# Patient Record
Sex: Female | Born: 1990 | Race: White | Hispanic: No | Marital: Single
Health system: Southern US, Community
[De-identification: ages and names within clinical notes are randomized; demographics above are authoritative.]

## PROBLEM LIST (undated history)

## (undated) DIAGNOSIS — I471 Supraventricular tachycardia, unspecified: Secondary | ICD-10-CM

---

## 2018-02-02 ENCOUNTER — Emergency Department (HOSPITAL_COMMUNITY): Payer: PRIVATE HEALTH INSURANCE

## 2018-02-02 ENCOUNTER — Emergency Department (HOSPITAL_COMMUNITY)
Admission: EM | Admit: 2018-02-02 | Discharge: 2018-02-03 | Disposition: A | Discharge status: Home / Self Care | Payer: PRIVATE HEALTH INSURANCE | Attending: Emergency Medicine | Admitting: Emergency Medicine

## 2018-02-02 ENCOUNTER — Encounter (HOSPITAL_COMMUNITY): Payer: Self-pay | Admitting: Emergency Medicine

## 2018-02-02 DIAGNOSIS — Y999 Unspecified external cause status: Secondary | ICD-10-CM | POA: Diagnosis not present

## 2018-02-02 DIAGNOSIS — R55 Syncope and collapse: Secondary | ICD-10-CM

## 2018-02-02 DIAGNOSIS — W1789XA Other fall from one level to another, initial encounter: Secondary | ICD-10-CM | POA: Insufficient documentation

## 2018-02-02 DIAGNOSIS — S29012A Strain of muscle and tendon of back wall of thorax, initial encounter: Secondary | ICD-10-CM

## 2018-02-02 DIAGNOSIS — I471 Supraventricular tachycardia: Secondary | ICD-10-CM | POA: Insufficient documentation

## 2018-02-02 DIAGNOSIS — Y929 Unspecified place or not applicable: Secondary | ICD-10-CM | POA: Diagnosis not present

## 2018-02-02 DIAGNOSIS — M542 Cervicalgia: Secondary | ICD-10-CM | POA: Diagnosis not present

## 2018-02-02 DIAGNOSIS — S0181XA Laceration without foreign body of other part of head, initial encounter: Secondary | ICD-10-CM | POA: Diagnosis not present

## 2018-02-02 DIAGNOSIS — Y939 Activity, unspecified: Secondary | ICD-10-CM | POA: Insufficient documentation

## 2018-02-02 HISTORY — DX: Supraventricular tachycardia: I47.1

## 2018-02-02 HISTORY — DX: Supraventricular tachycardia, unspecified: I47.10

## 2018-02-02 LAB — BASIC METABOLIC PANEL
ANION GAP: 12 (ref 5–15)
BUN: 12 mg/dL (ref 6–20)
CALCIUM: 9 mg/dL (ref 8.9–10.3)
CO2: 22 mmol/L (ref 22–32)
Chloride: 104 mmol/L (ref 98–111)
Creatinine, Ser: 0.72 mg/dL (ref 0.44–1.00)
Glucose, Bld: 97 mg/dL (ref 70–99)
Potassium: 3.6 mmol/L (ref 3.5–5.1)
Sodium: 138 mmol/L (ref 135–145)

## 2018-02-02 LAB — I-STAT BETA HCG BLOOD, ED (MC, WL, AP ONLY)

## 2018-02-02 LAB — CBG MONITORING, ED: GLUCOSE-CAPILLARY: 96 mg/dL (ref 70–99)

## 2018-02-02 MED ORDER — FENTANYL CITRATE (PF) 100 MCG/2ML IJ SOLN
50.0000 ug | INTRAMUSCULAR | Status: DC | PRN
  Administered 2018-02-02: 50 ug via INTRAVENOUS
  Filled 2018-02-02: qty 2

## 2018-02-02 MED ORDER — SODIUM CHLORIDE 0.9 % IV BOLUS
1000.0000 mL | Freq: Once | INTRAVENOUS | Status: AC
  Administered 2018-02-02: 1000 mL via INTRAVENOUS

## 2018-02-02 MED ORDER — LIDOCAINE-EPINEPHRINE (PF) 2 %-1:200000 IJ SOLN
20.0000 mL | Freq: Once | INTRAMUSCULAR | Status: DC
  Filled 2018-02-02: qty 20

## 2018-02-02 NOTE — ED Provider Notes (Signed)
[facility] EMERGENCY DEPARTMENT Provider Note   CSN: [id number] Arrival date & time: 02/02/18  2145     History   Chief Complaint Chief Complaint  Patient presents with  . Loss of Consciousness  . Laceration    HPI Natalie Jackson is a 27 y.o. female.  Patient presented with chin laceration and back pain since syncopal episode at the concert.  Patient was in the heat and did not have very much to drink.  She started feeling lightheaded and then gradually felt worse and then blacked out.  Patient remembers people helping her up after she passed out and she thinks she hit stairs.  Patient has a history of SVT however no other cardiac history.  Patient denies alcohol or drug abuse.  Pain with range of motion     Past Medical History:  Diagnosis Date  . SVT (supraventricular tachycardia) (HCC)    occasional    There are no active problems to display for this patient.   History reviewed. No pertinent surgical history.   OB History   None      Home Medications    Prior to Admission medications   Not on File    Family History History reviewed. No pertinent family history.  Social History Social History   Tobacco Use  . Smoking status: Not on file  Substance Use Topics  . Alcohol use: Not on file  . Drug use: Not on file     Allergies   Patient has no allergy information on record.   Review of Systems Review of Systems  Constitutional: Negative for chills and fever.  HENT: Negative for congestion.   Eyes: Negative for visual disturbance.  Respiratory: Negative for shortness of breath.   Cardiovascular: Negative for chest pain.  Gastrointestinal: Negative for abdominal pain and vomiting.  Genitourinary: Negative for dysuria and flank pain.  Musculoskeletal: Positive for back pain and neck pain. Negative for neck stiffness.  Skin: Negative for rash.  Neurological: Positive for syncope and light-headedness. Negative for headaches.      Physical Exam Updated Vital Signs BP 107/66   Pulse 80   Temp (!) 97.5 F (36.4 C) (Oral)   Resp 18   Ht 5\' 5"  (1.651 m)   Wt 62.1 kg   SpO2 100%   BMI 22.80 kg/m   Physical Exam  Constitutional: She is oriented to person, place, and time. She appears well-developed and well-nourished.  HENT:  Head: Normocephalic.  Patient has 2.5 cm laceration beneath the chin mild gaping.  Eyes: Conjunctivae are normal. Right eye exhibits no discharge. Left eye exhibits no discharge.  Neck: Normal range of motion. Neck supple. No tracheal deviation present.  Cardiovascular: Normal rate and regular rhythm.  Pulmonary/Chest: Effort normal and breath sounds normal.  Abdominal: Soft. She exhibits no distension. There is no tenderness. There is no guarding.  Musculoskeletal: She exhibits tenderness. She exhibits no edema.  Patient has mild tenderness lower cervical region midline, mid and lower thoracic and proximal lumbar midline and paraspinal.  C-collar in place.  Neurological: She is alert and oriented to person, place, and time. She has normal strength. No cranial nerve deficit. GCS eye subscore is 4. GCS verbal subscore is 5. GCS motor subscore is 6.  Skin: Skin is warm. No rash noted.  Psychiatric: She has a normal mood and affect.  Nursing note and vitals reviewed.    ED Treatments / Results  Labs (all labs ordered are listed, but only abnormal results are  displayed) Labs Reviewed  CBC - Abnormal; Notable for the following components:      Result Value   WBC 10.7 (*)    RBC 3.81 (*)    All other components within normal limits  BASIC METABOLIC PANEL  URINALYSIS, ROUTINE W REFLEX MICROSCOPIC  CBG MONITORING, ED  I-STAT BETA HCG BLOOD, ED ([facility], [facility], [facility] ONLY)    EKG EKG Interpretation  Date/Time:  Saturday February 02 2018 22:40:09 EDT Ventricular Rate:  84 PR Interval:    QRS Duration: 83 QT Interval:  365 QTC Calculation: 432 R Axis:   74 Text Interpretation:  Sinus  rhythm Probable left atrial enlargement RSR' in V1 or V2, probably normal variant Nonspecific T abnrm, anterolateral leads Confirmed by Blane Ohara [phone]) on 02/02/2018 11:26:56 PM   Radiology Dg Thoracic Spine 2 View  Result Date: 02/02/2018 CLINICAL DATA:  Fall, syncopal episode EXAM: THORACIC SPINE 2 VIEWS COMPARISON:  None. FINDINGS: Twelve rib pairs. Thoracic alignment is within normal limits. The vertebral body heights are maintained. IMPRESSION: No acute osseous abnormality Electronically Signed   By: Jasmine Pang M.D.   On: 02/02/2018 23:51   Dg Lumbar Spine 2-3 Views  Result Date: 02/02/2018 CLINICAL DATA:  Fall EXAM: LUMBAR SPINE - 2-3 VIEW COMPARISON:  None. FINDINGS: There is no evidence of lumbar spine fracture. Alignment is normal. Intervertebral disc spaces are maintained. IMPRESSION: Negative. Electronically Signed   By: Jasmine Pang M.D.   On: 02/02/2018 23:52   Ct Cervical Spine Wo Contrast  Result Date: 02/03/2018 CLINICAL DATA:  27 year old female with C-spine trauma. EXAM: CT CERVICAL SPINE WITHOUT CONTRAST TECHNIQUE: Multidetector CT imaging of the cervical spine was performed without intravenous contrast. Multiplanar CT image reconstructions were also generated. COMPARISON:  None. FINDINGS: Alignment: Normal. Skull base and vertebrae: No acute fracture. No primary bone lesion or focal pathologic process. Soft tissues and spinal canal: No prevertebral fluid or swelling. No visible canal hematoma. Disc levels:  No acute findings.  No degenerative changes. Upper chest: Negative. Other: None IMPRESSION: No acute/traumatic cervical spine pathology. Electronically Signed   By: Elgie Collard M.D.   On: 02/03/2018 00:21    Procedures .Marland KitchenLaceration Repair Date/Time: 02/03/2018 12:28 AM Performed by: Blane Ohara, MD Authorized by: Blane Ohara, MD   Consent:    Consent obtained:  Verbal   Consent given by:  Patient   Risks discussed:  Infection, pain, retained  foreign body, poor cosmetic result, need for additional repair, nerve damage and poor wound healing   Alternatives discussed:  No treatment Anesthesia (see MAR for exact dosages):    Anesthesia method:  Local infiltration   Local anesthetic:  Lidocaine 2% WITH epi Laceration details:    Location:  Face   Face location:  Chin   Length (cm):  3   Depth (mm):  5 Repair type:    Repair type:  Simple Pre-procedure details:    Preparation:  Patient was prepped and draped in usual sterile fashion and imaging obtained to evaluate for foreign bodies Exploration:    Hemostasis achieved with:  Direct pressure   Wound exploration: wound explored through full range of motion     Wound extent: no muscle damage noted, no nerve damage noted and no tendon damage noted     Contaminated: no   Treatment:    Area cleansed with:  Shur-Clens   Amount of cleaning:  Standard   Irrigation solution:  Sterile saline   Irrigation volume:  10   Irrigation method:  Syringe  Visualized foreign bodies/material removed: no   Skin repair:    Repair method:  Sutures   Suture size:  5-0   Suture material:  Prolene   Suture technique:  Simple interrupted   Number of sutures:  4 Approximation:    Approximation:  Close Post-procedure details:    Dressing:  Open (no dressing)   Patient tolerance of procedure:  Tolerated well, no immediate complications   (including critical care time)  Medications Ordered in ED Medications  fentaNYL (SUBLIMAZE) injection 50 mcg (50 mcg Intravenous Given 02/02/18 2305)  lidocaine-EPINEPHrine (XYLOCAINE W/EPI) 2 %-1:200000 (PF) injection 20 mL (has no administration in time range)  ibuprofen (ADVIL,MOTRIN) tablet 600 mg (has no administration in time range)  sodium chloride 0.9 % bolus 1,000 mL (0 mLs Intravenous Stopped 02/03/18 0006)     Initial Impression / Assessment and Plan / ED Course  I have reviewed the triage vital signs and the nursing notes.  Pertinent labs &  imaging results that were available during my care of the patient were reviewed by me and considered in my medical decision making (see chart for details).    Patient presents after syncopal episode gradual onset likely from dehydration and not keeping up with fluids in the heat.  EKG reviewed no acute findings.  Plan for screening blood work, IV fluid bolus, x-rays and CT cervical.  Laceration repair and if unremarkable outpatient follow-up. CT scan negative for acute fracture x-rays no fracture.  Pain meds given in the ER.  Laceration repaired without difficulty.  Follow-up discussed blood work reviewed with patient no acute findings pregnancy test negative.  IV fluids given.  Results and differential diagnosis were discussed with the patient/parent/guardian. Xrays were independently reviewed by myself.  Close follow up outpatient was discussed, comfortable with the plan.   Medications  fentaNYL (SUBLIMAZE) injection 50 mcg (50 mcg Intravenous Given 02/02/18 2305)  lidocaine-EPINEPHrine (XYLOCAINE W/EPI) 2 %-1:200000 (PF) injection 20 mL (has no administration in time range)  ibuprofen (ADVIL,MOTRIN) tablet 600 mg (has no administration in time range)  sodium chloride 0.9 % bolus 1,000 mL (0 mLs Intravenous Stopped 02/03/18 0006)    Vitals:   02/02/18 2153 02/02/18 2154 02/02/18 2245 02/02/18 2300  BP: 130/69  115/66 107/66  Pulse: 75  80 80  Resp: 18     Temp: (!) 97.5 F (36.4 C)     TempSrc: Oral     SpO2: 100%  100% 100%  Weight:  62.1 kg    Height:  5\' 5"  (1.651 m)      Final diagnoses:  Facial laceration, initial encounter  Syncope and collapse  Strain of thoracic back region      Final Clinical Impressions(s) / ED Diagnoses   Final diagnoses:  Facial laceration, initial encounter  Syncope and collapse  Strain of thoracic back region    ED Discharge Orders    None       Blane Ohara, MD 02/03/18 [phone]

## 2018-02-02 NOTE — Discharge Instructions (Signed)
Take Tylenol and Motrin as needed for pain use ice as needed. Stay well-hydrated follow-up with primary doctor. Sutures removed in approximately 5 to 6 days. Watch for signs of infection of laceration.

## 2018-02-02 NOTE — ED Triage Notes (Signed)
  Patient BIB EMS after syncopal episode at concert.  Patient was at concert and had not had anything to eat/drink since earlier this morning and said she felt herself about to pass out.  Patient tried to get some water and blacked out causing her to fall face first up some stairs.  Patient is complaining of lower back pain and has 1 in laceration on her chin.  Patient states her only hx is occasional SVT.  Patient is A&O x 4.  No ETOH or drug use per patient.

## 2018-02-03 LAB — CBC
HCT: 37.4 % (ref 36.0–46.0)
HEMOGLOBIN: 12 g/dL (ref 12.0–15.0)
MCH: 31.5 pg (ref 26.0–34.0)
MCHC: 32.1 g/dL (ref 30.0–36.0)
MCV: 98.2 fL (ref 78.0–100.0)
Platelets: DECREASED 10*3/uL (ref 150–400)
RBC: 3.81 MIL/uL — ABNORMAL LOW (ref 3.87–5.11)
RDW: 13 % (ref 11.5–15.5)
WBC: 10.7 10*3/uL — ABNORMAL HIGH (ref 4.0–10.5)

## 2018-02-03 MED ORDER — IBUPROFEN 400 MG PO TABS
600.0000 mg | ORAL_TABLET | Freq: Once | ORAL | Status: AC
  Administered 2018-02-03: 600 mg via ORAL
  Filled 2018-02-03: qty 1

## 2020-06-14 IMAGING — DX DG LUMBAR SPINE 2-3V
3 series · 3 of 3 positions shown · non-contrast
Comparison: None.

CLINICAL DATA: Fall

EXAM:
LUMBAR SPINE - 2-3 VIEW

[l-spine ap]
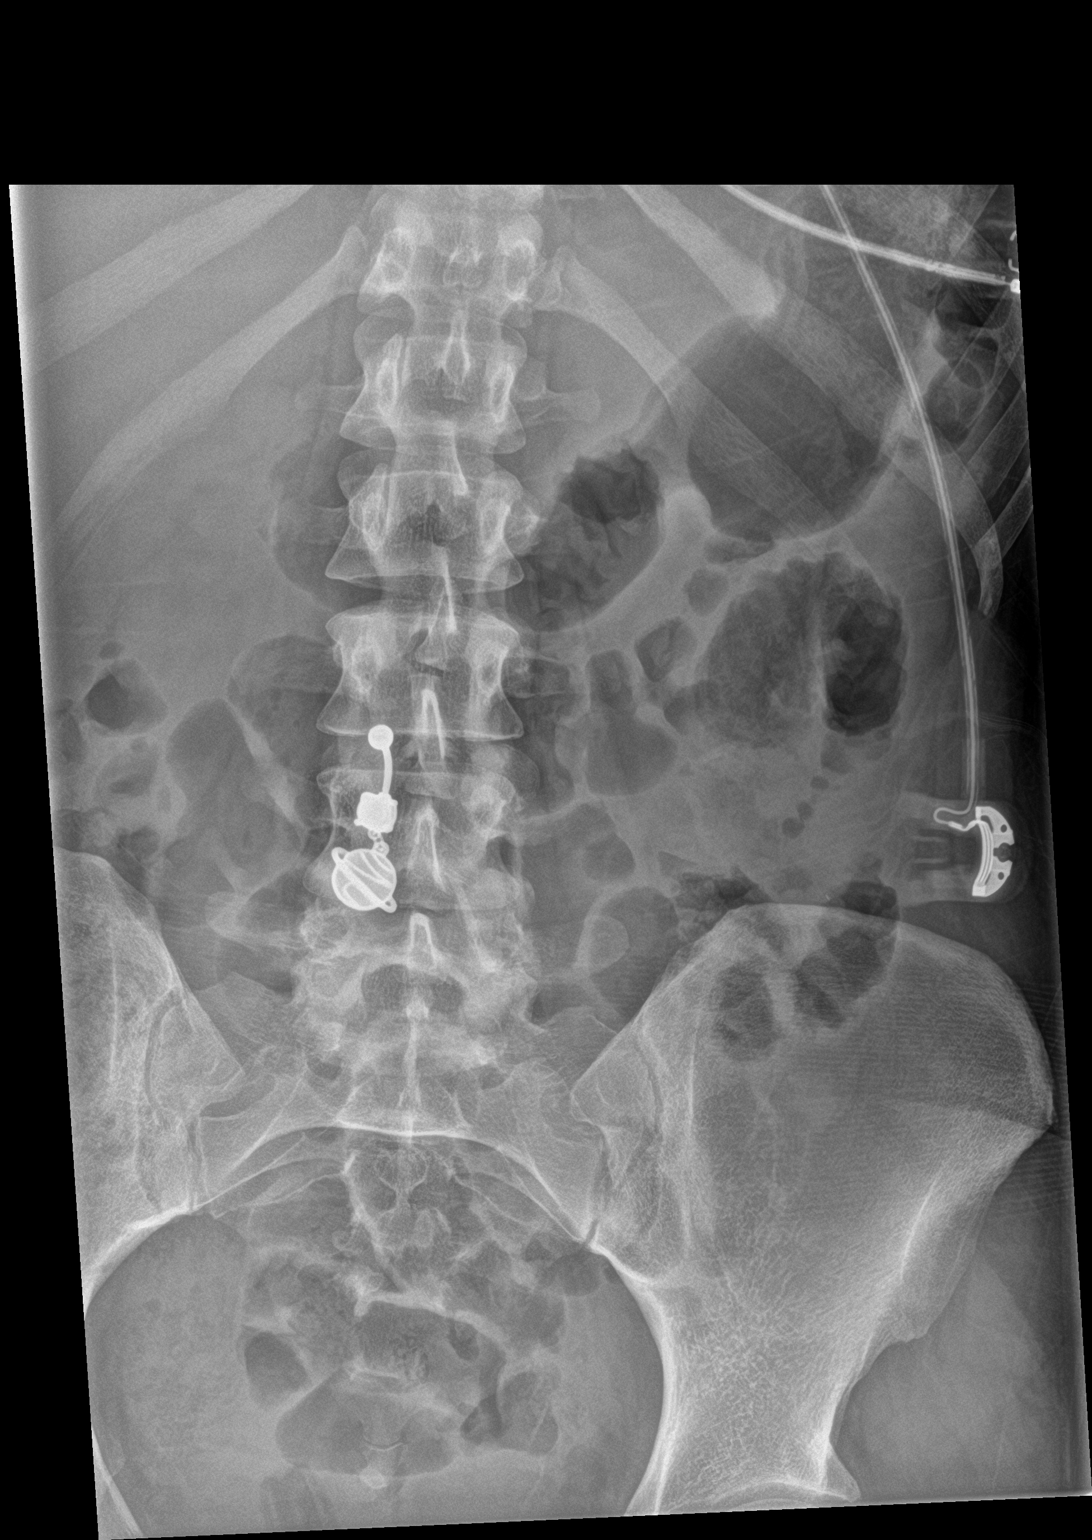

[l-spine lat]
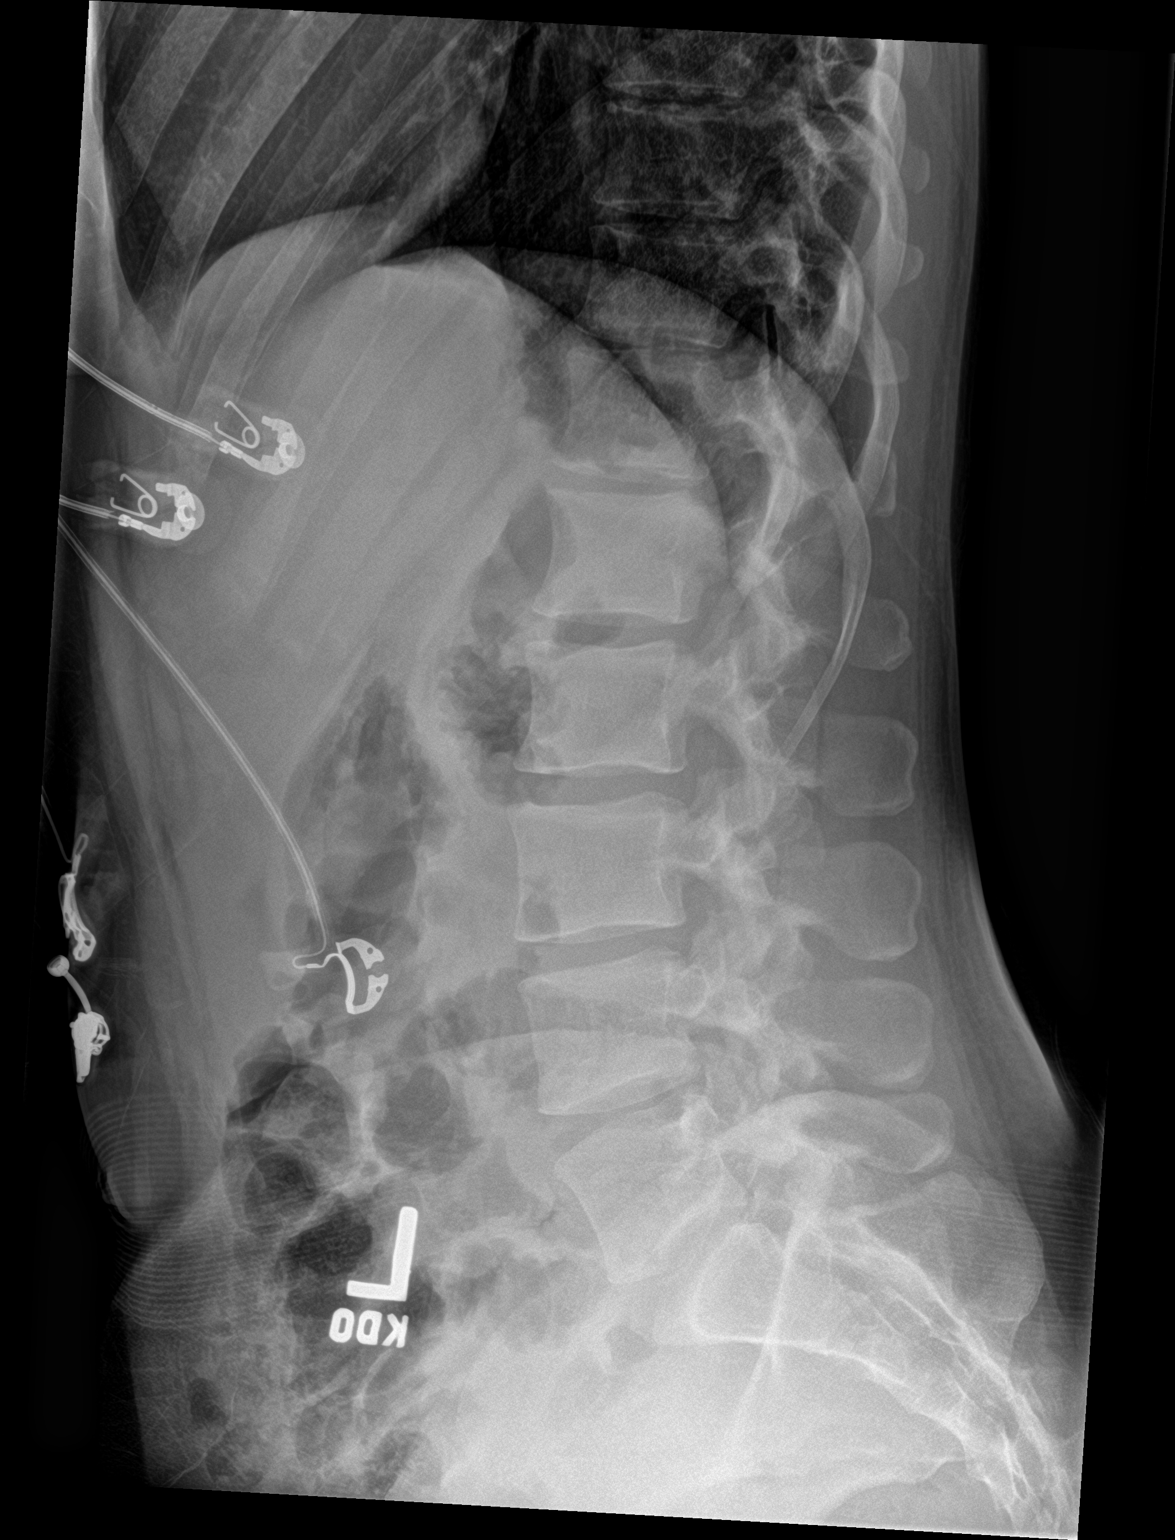

[l-spine spot]
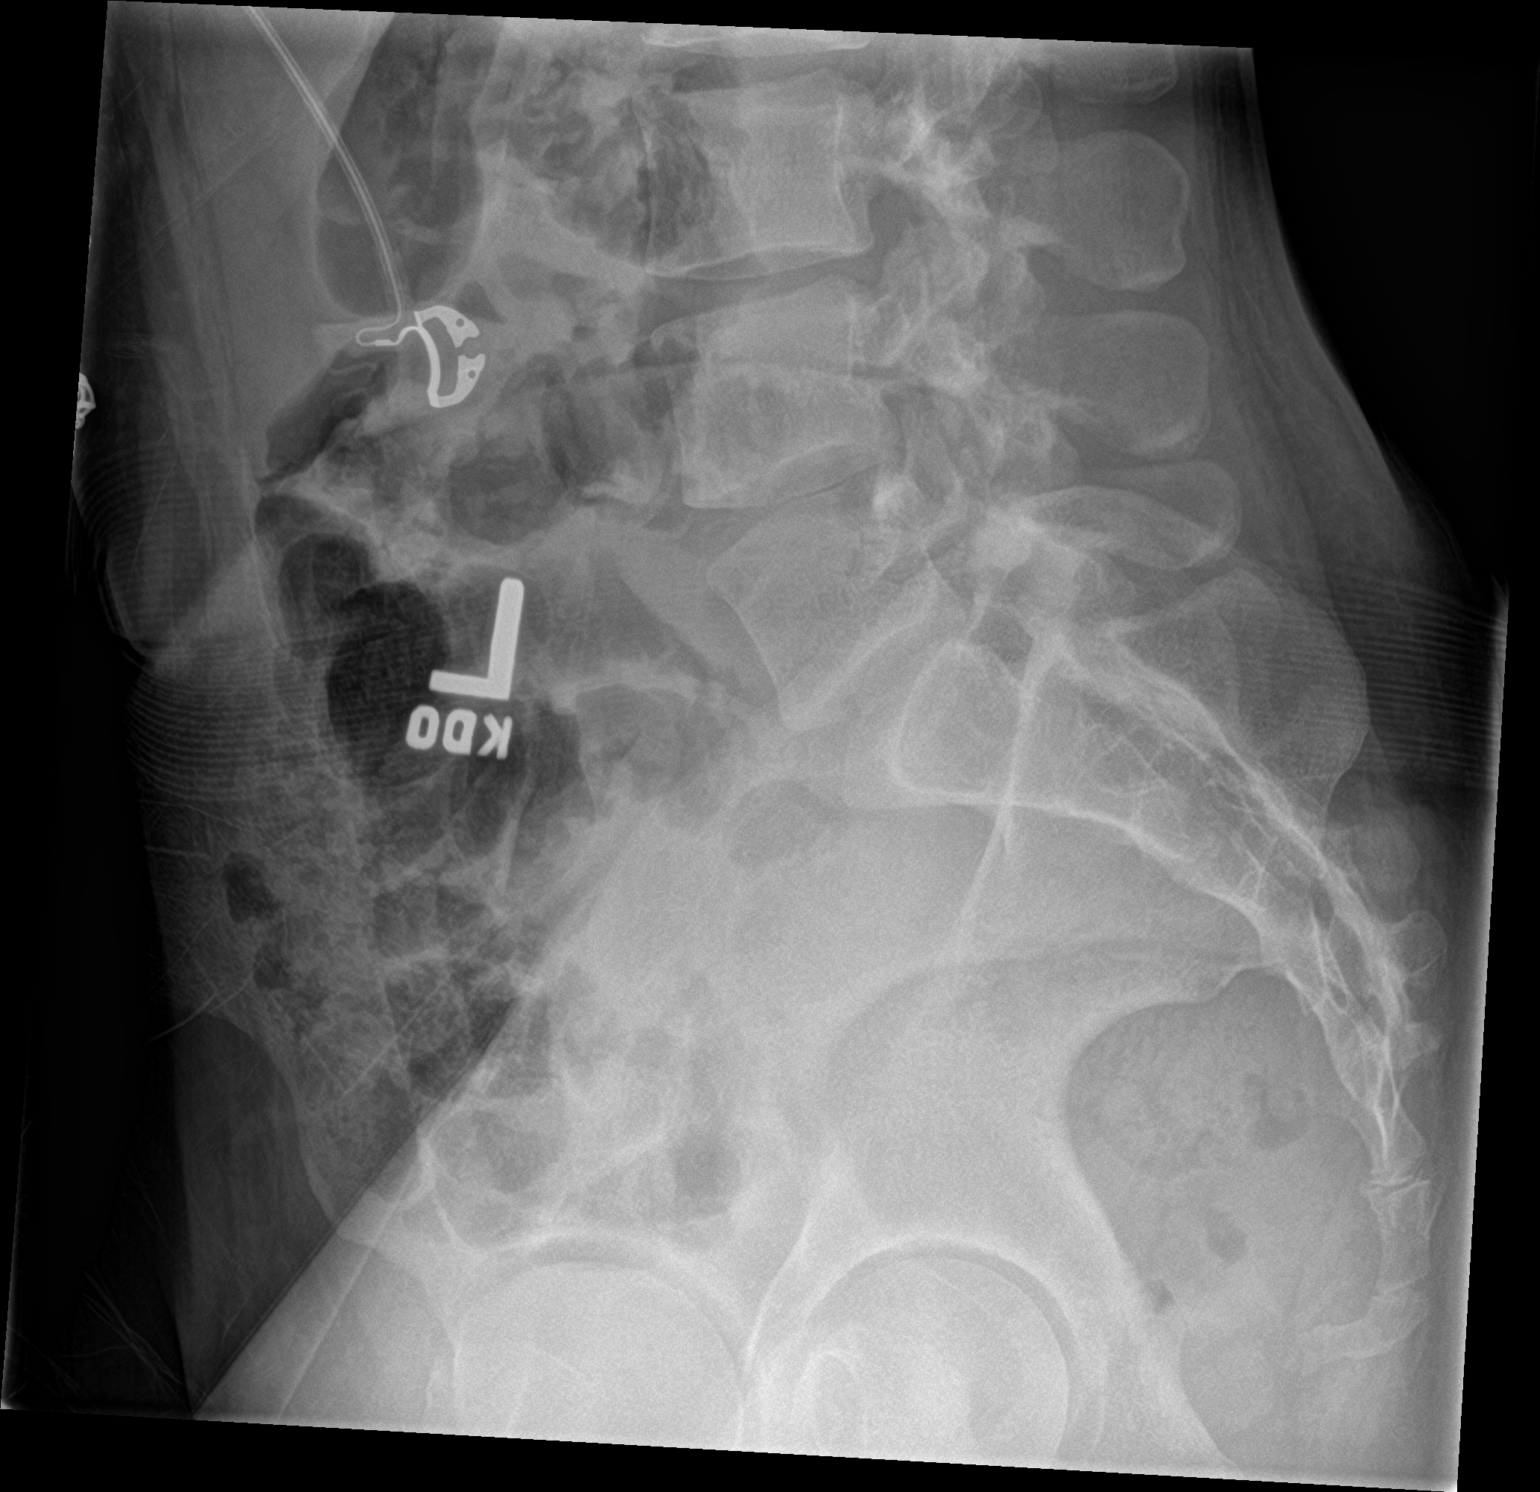

[3 of 3 positions shown; findings below may reference images not displayed]

FINDINGS: There is no evidence of lumbar spine fracture. Alignment is normal.
Intervertebral disc spaces are maintained.
IMPRESSION: Negative.
